# Patient Record
Sex: Female | Born: 1975 | Race: White | Hispanic: No | Marital: Single | Smoking: Current every day smoker
Health system: Southern US, Community
[De-identification: ages and names within clinical notes are randomized; demographics above are authoritative.]

## PROBLEM LIST (undated history)

## (undated) HISTORY — PX: DILATION AND CURETTAGE OF UTERUS: SHX78

---

## 2001-03-15 ENCOUNTER — Emergency Department (HOSPITAL_COMMUNITY): Admission: EM | Admit: 2001-03-15 | Discharge: 2001-03-15 | Payer: Self-pay | Admitting: Emergency Medicine

## 2001-03-15 ENCOUNTER — Encounter: Payer: Self-pay | Admitting: Emergency Medicine

## 2001-11-03 ENCOUNTER — Emergency Department (HOSPITAL_COMMUNITY): Admission: EM | Admit: 2001-11-03 | Discharge: 2001-11-03 | Payer: Self-pay | Admitting: Emergency Medicine

## 2001-11-03 ENCOUNTER — Encounter: Payer: Self-pay | Admitting: Emergency Medicine

## 2002-01-30 ENCOUNTER — Emergency Department (HOSPITAL_COMMUNITY): Admission: EM | Admit: 2002-01-30 | Discharge: 2002-01-30 | Payer: Self-pay | Admitting: Emergency Medicine

## 2002-05-06 ENCOUNTER — Emergency Department (HOSPITAL_COMMUNITY): Admission: EM | Admit: 2002-05-06 | Discharge: 2002-05-06 | Payer: Self-pay | Admitting: Emergency Medicine

## 2003-07-28 ENCOUNTER — Emergency Department (HOSPITAL_COMMUNITY): Admission: EM | Admit: 2003-07-28 | Discharge: 2003-07-28 | Payer: Self-pay | Admitting: Emergency Medicine

## 2018-05-30 ENCOUNTER — Encounter (HOSPITAL_COMMUNITY): Payer: Self-pay

## 2018-05-30 ENCOUNTER — Emergency Department (HOSPITAL_COMMUNITY): Payer: Self-pay

## 2018-05-30 ENCOUNTER — Emergency Department (HOSPITAL_COMMUNITY)
Admission: EM | Admit: 2018-05-30 | Discharge: 2018-05-30 | Disposition: A | Discharge status: Home / Self Care | Payer: Self-pay | Attending: Emergency Medicine | Admitting: Emergency Medicine

## 2018-05-30 ENCOUNTER — Other Ambulatory Visit: Payer: Self-pay

## 2018-05-30 DIAGNOSIS — J029 Acute pharyngitis, unspecified: Secondary | ICD-10-CM | POA: Insufficient documentation

## 2018-05-30 DIAGNOSIS — R05 Cough: Secondary | ICD-10-CM | POA: Insufficient documentation

## 2018-05-30 DIAGNOSIS — R059 Cough, unspecified: Secondary | ICD-10-CM

## 2018-05-30 MED ORDER — PREDNISONE 20 MG PO TABS
60.0000 mg | ORAL_TABLET | ORAL | Status: AC
  Administered 2018-05-30: 60 mg via ORAL
  Filled 2018-05-30: qty 3

## 2018-05-30 MED ORDER — PREDNISONE 20 MG PO TABS: 40.0000 mg | ORAL_TABLET | Freq: Every day | ORAL | 0 refills | Status: AC

## 2018-05-30 MED ORDER — ALBUTEROL SULFATE HFA 108 (90 BASE) MCG/ACT IN AERS
2.0000 | INHALATION_SPRAY | Freq: Four times a day (QID) | RESPIRATORY_TRACT | Status: DC
  Filled 2018-05-30: qty 6.7

## 2018-05-30 NOTE — ED Provider Notes (Signed)
[facility]-EMERGENCY DEPT Provider Note   CSN: [id number] Arrival date & time: 05/30/18  1011     History   Chief Complaint Chief Complaint  Patient presents with  . Cough  . Sore Throat    HPI Danielle Duncan is a 42 y.o. female.  HPI Patient presents with concern of inconsistent/intermittent cough, sore throat. Onset was about 2 months ago, and since onset she has had persistent episodes, occurring without clear precipitant. Each episode lasts a few days, improves, without clear intervention. She did have a fever about 2 months ago, but denies recent episodes. She is previously used BC powder for relief, but is unclear if she has done so recently. With concern for the persistency of these episodes, she presents for evaluation.  When she has not seen her physician during this illness. Patient smokes, drinks, but states that she is otherwise healthy.  History reviewed. No pertinent past medical history.  There are no active problems to display for this patient.   Past Surgical History:  Procedure Laterality Date  . DILATION AND CURETTAGE OF UTERUS       OB History   None      Home Medications    Prior to Admission medications   Medication Sig Start Date End Date Taking? Authorizing Provider  predniSONE (DELTASONE) 20 MG tablet Take 2 tablets (40 mg total) by mouth daily with breakfast. For the next four days 05/30/18   Gerhard Munch, MD    Family History History reviewed. No pertinent family history.  Social History Social History   Tobacco Use  . Smoking status: Current Every Day Smoker    Packs/day: 0.50    Types: Cigarettes  . Smokeless tobacco: Never Used  Substance Use Topics  . Alcohol use: Yes  . Drug use: Never     Allergies   Patient has no known allergies.   Review of Systems Review of Systems  Constitutional:       Per HPI, otherwise negative  HENT:       Per HPI, otherwise negative  Respiratory:       Per  HPI, otherwise negative  Cardiovascular:       Per HPI, otherwise negative  Gastrointestinal: Negative for vomiting.  Endocrine:       Negative aside from HPI  Genitourinary:       Neg aside from HPI   Musculoskeletal:       Per HPI, otherwise negative  Skin: Negative.   Neurological: Negative for syncope.     Physical Exam Updated Vital Signs BP (!) 137/98   Pulse 75   Temp 98 F (36.7 C) (Oral)   Resp 15   Ht 5\' 6"  (1.676 m)   Wt 68 kg (150 lb)   LMP 05/28/2018   SpO2 98%   BMI 24.21 kg/m   Physical Exam  Constitutional: She is oriented to person, place, and time. She appears well-developed and well-nourished. No distress.  HENT:  Head: Normocephalic and atraumatic.  Mouth/Throat: No oral lesions. No uvula swelling. No oropharyngeal exudate, posterior oropharyngeal edema or posterior oropharyngeal erythema.  Eyes: Conjunctivae and EOM are normal.  Cardiovascular: Normal rate and regular rhythm.  Pulmonary/Chest: Effort normal and breath sounds normal. No stridor. No respiratory distress.  Abdominal: She exhibits no distension.  Musculoskeletal: She exhibits no edema.  Neurological: She is alert and oriented to person, place, and time. No cranial nerve deficit.  Skin: Skin is warm and dry.  Psychiatric: She has a normal mood  and affect.  Nursing note and vitals reviewed.    ED Treatments / Results   Radiology Dg Chest 2 View  Result Date: 05/30/2018 CLINICAL DATA:  Congestion and cough for 2 months, smoker EXAM: CHEST - 2 VIEW COMPARISON:  None FINDINGS: Normal heart size, mediastinal contours, and pulmonary vascularity. Lungs clear. No pleural effusion or pneumothorax. Bones unremarkable. IMPRESSION: Normal exam. Electronically Signed   By: Ulyses SouthwardMark  Boles M.D.   On: 05/30/2018 11:20    Procedures Procedures (including critical care time)  Medications Ordered in ED Medications  albuterol (PROVENTIL HFA;VENTOLIN HFA) 108 (90 Base) MCG/ACT inhaler 2 puff (has no  administration in time range)  predniSONE (DELTASONE) tablet 60 mg (has no administration in time range)     Initial Impression / Assessment and Plan / ED Course  I have reviewed the triage vital signs and the nursing notes.  Pertinent labs & imaging results that were available during my care of the patient were reviewed by me and considered in my medical decision making (see chart for details).     11:39 AM X-ray unremarkable. This well-appearing female presents with intermittent cough, sore throat for several months. Here she is awake, alert, no hypoxia, evidence for systemic illness. Some suspicion for smoking contributing to her symptoms, and with reassuring physical exam, vitals, x-ray, the patient and I discussed options for symptomatic relief. Patient will start a short course of albuterol, steroids, follow-up with primary care.  And she was encouraged to stop smoking.   Final Clinical Impressions(s) / ED Diagnoses   Final diagnoses:  Cough    ED Discharge Orders        Ordered    predniSONE (DELTASONE) 20 MG tablet  Daily with breakfast     05/30/18 1138       Gerhard MunchLockwood, Asaph Serena, MD 05/30/18 1139

## 2018-05-30 NOTE — ED Notes (Addendum)
Unable to e-sign, patient verbalizes understanding of all d/c instructions and has no further questions. Inhaler given to patient.

## 2018-05-30 NOTE — Discharge Instructions (Signed)
As discussed, your evaluation today has been largely reassuring.  But, it is important that you monitor your condition carefully, and do not hesitate to return to the ED if you develop new, or concerning changes in your condition. ? ?Otherwise, please follow-up with your physician for appropriate ongoing care. ? ?

## 2018-05-30 NOTE — ED Triage Notes (Signed)
Patient c/o intermittent sore throat and a productive cough x 3 months.

## 2019-06-30 IMAGING — CR DG CHEST 2V
2 series · 2 of 2 positions shown · non-contrast
Comparison: None

CLINICAL DATA: Congestion and cough for 2 months, smoker

EXAM:
CHEST - 2 VIEW

[w chest pa]
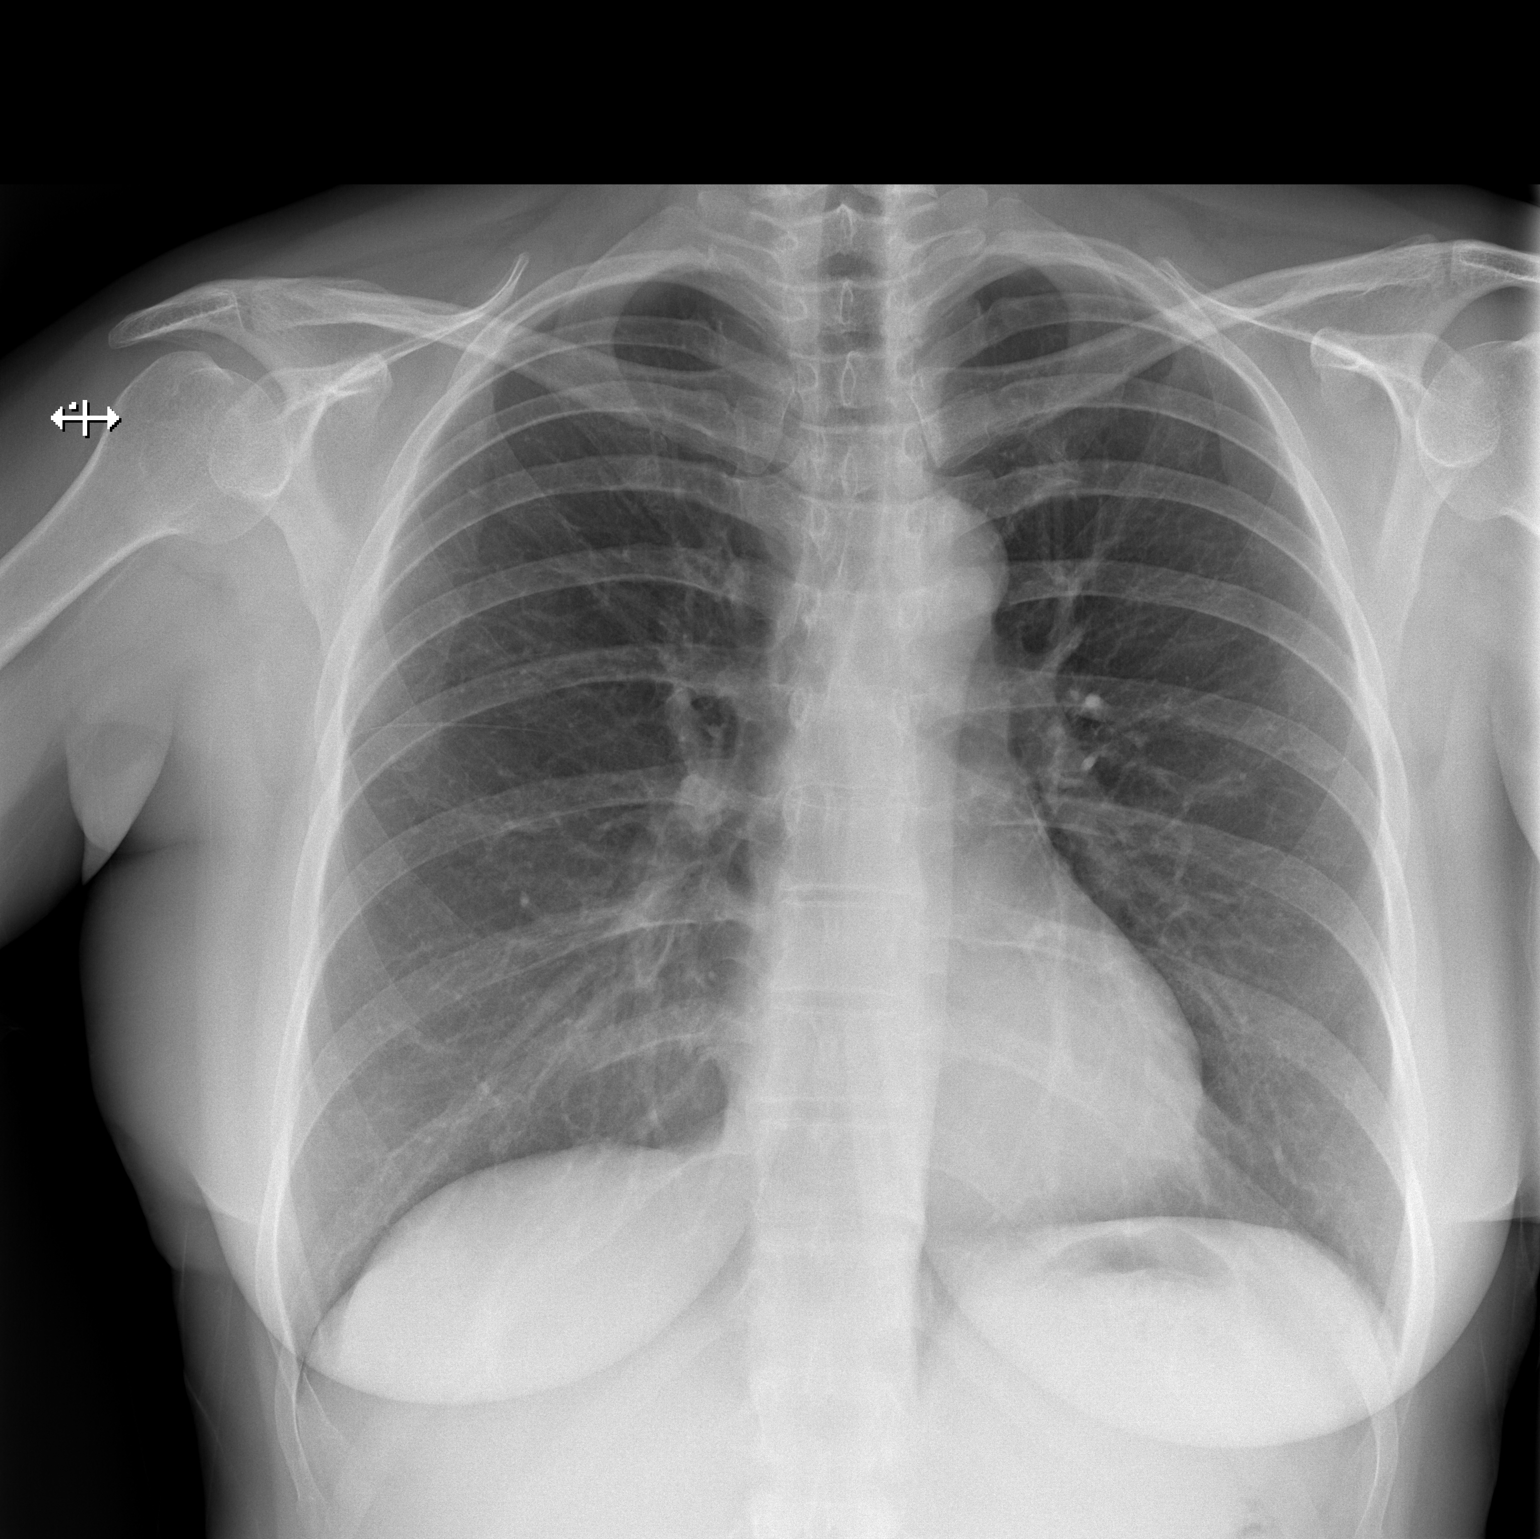

[w chest lat]
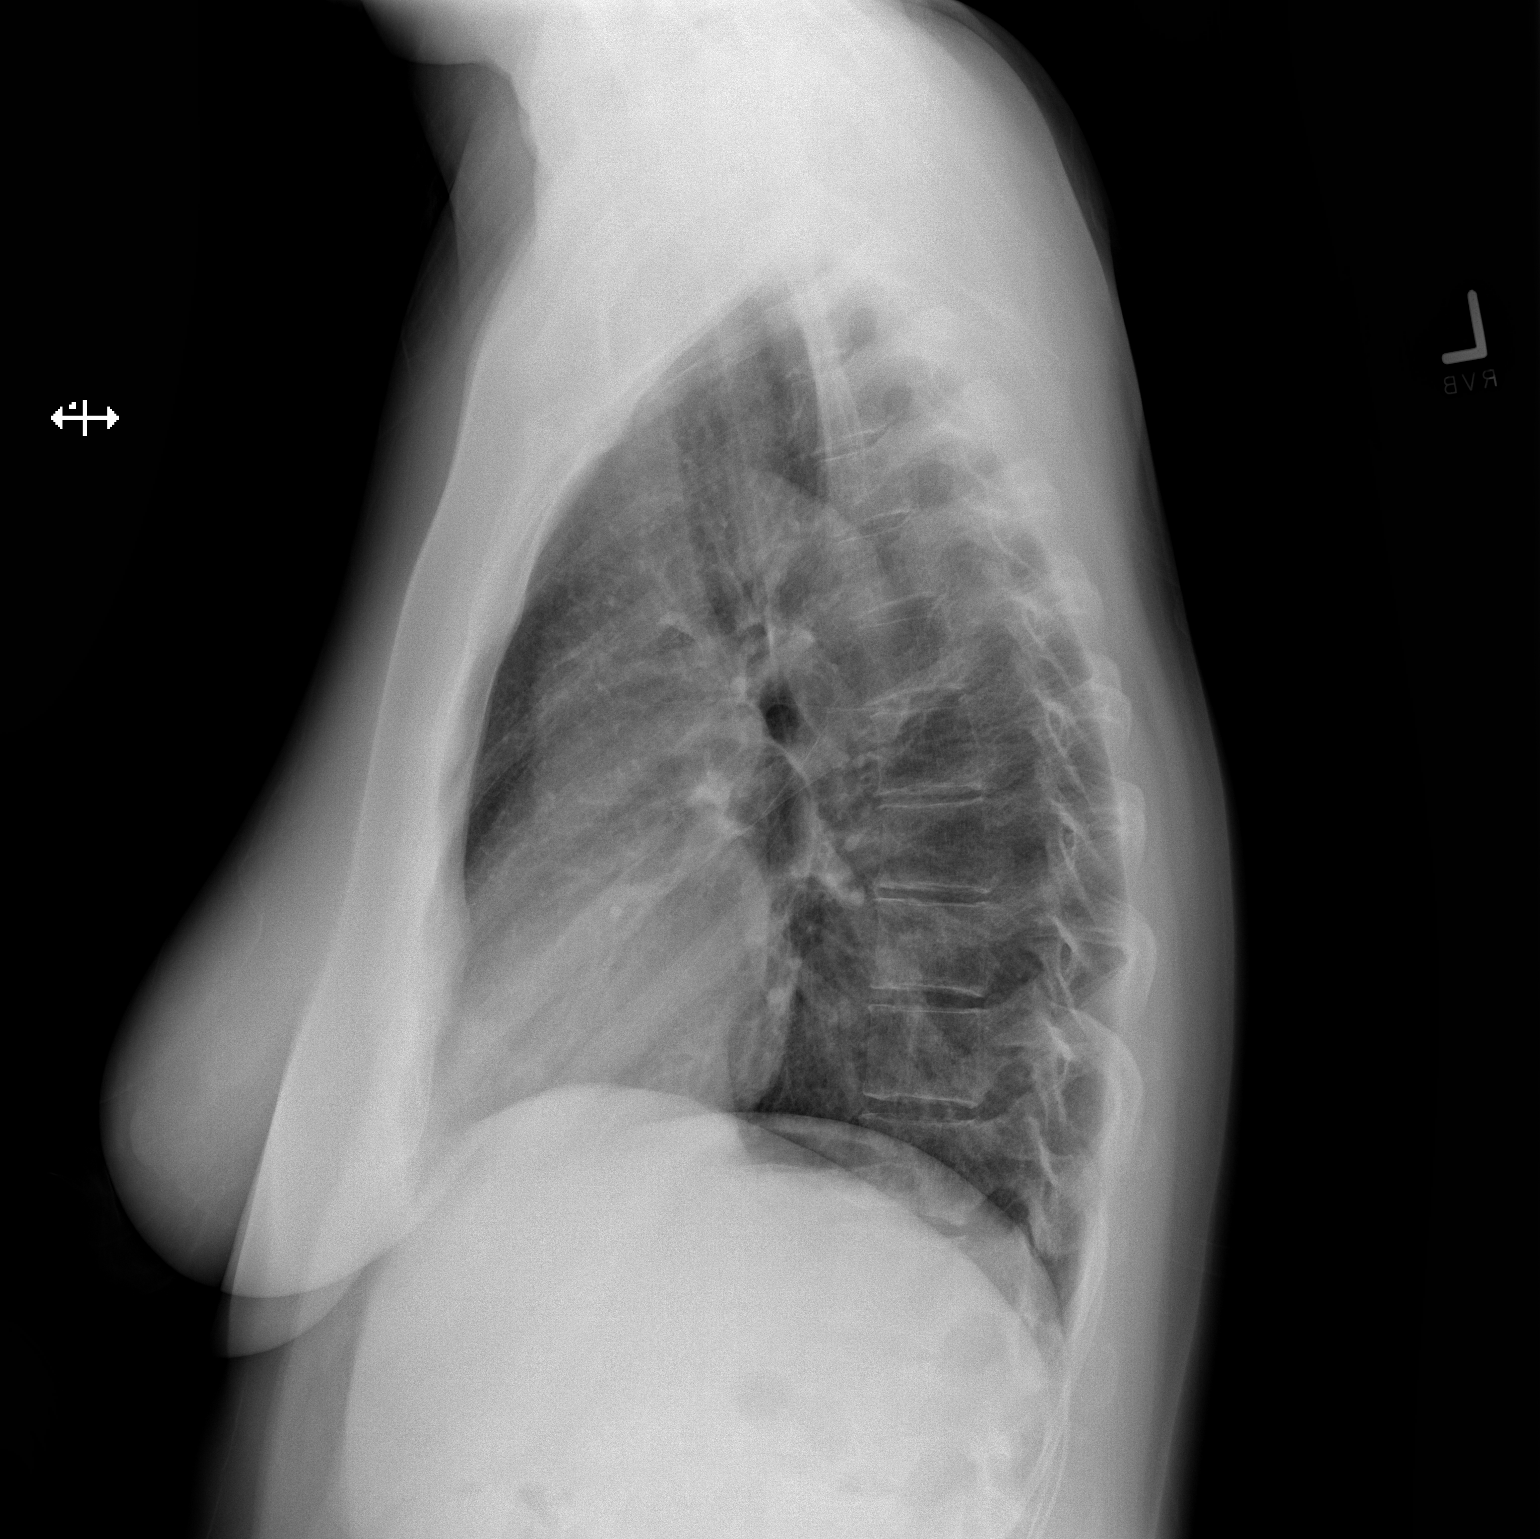

[2 of 2 positions shown; findings below may reference images not displayed]

FINDINGS: Normal heart size, mediastinal contours, and pulmonary vascularity.

Lungs clear.

No pleural effusion or pneumothorax.

Bones unremarkable.
IMPRESSION: Normal exam.
